# Patient Record
Sex: Male | Born: 1960 | Race: White | Hispanic: No | Marital: Married | Smoking: Never smoker
Health system: Southern US, Community
[De-identification: ages and names within clinical notes are randomized; demographics above are authoritative.]

## PROBLEM LIST (undated history)

## (undated) ENCOUNTER — Emergency Department (HOSPITAL_COMMUNITY): Discharge status: Absent without leave | Payer: 59

## (undated) DIAGNOSIS — M109 Gout, unspecified: Secondary | ICD-10-CM

## (undated) DIAGNOSIS — K429 Umbilical hernia without obstruction or gangrene: Secondary | ICD-10-CM

## (undated) DIAGNOSIS — K602 Anal fissure, unspecified: Secondary | ICD-10-CM

## (undated) HISTORY — DX: Umbilical hernia without obstruction or gangrene: K42.9

## (undated) HISTORY — PX: OTHER SURGICAL HISTORY: SHX169

---

## 2000-07-26 ENCOUNTER — Ambulatory Visit (HOSPITAL_COMMUNITY): Admission: RE | Admit: 2000-07-26 | Discharge: 2000-07-26 | Payer: Self-pay | Admitting: Internal Medicine

## 2000-07-26 ENCOUNTER — Encounter: Payer: Self-pay | Admitting: Internal Medicine

## 2001-12-27 ENCOUNTER — Emergency Department (HOSPITAL_COMMUNITY): Admission: EM | Admit: 2001-12-27 | Discharge: 2001-12-27 | Payer: Self-pay | Admitting: Emergency Medicine

## 2002-06-24 ENCOUNTER — Ambulatory Visit (HOSPITAL_COMMUNITY): Admission: RE | Admit: 2002-06-24 | Discharge: 2002-06-24 | Payer: Self-pay | Admitting: General Surgery

## 2003-10-22 ENCOUNTER — Ambulatory Visit (HOSPITAL_COMMUNITY): Admission: RE | Admit: 2003-10-22 | Discharge: 2003-10-22 | Payer: Self-pay | Admitting: Family Medicine

## 2010-04-11 ENCOUNTER — Encounter: Payer: Self-pay | Admitting: Internal Medicine

## 2010-07-15 ENCOUNTER — Other Ambulatory Visit (HOSPITAL_COMMUNITY): Payer: Self-pay | Admitting: Family Medicine

## 2010-07-15 DIAGNOSIS — M109 Gout, unspecified: Secondary | ICD-10-CM

## 2010-07-15 DIAGNOSIS — N509 Disorder of male genital organs, unspecified: Secondary | ICD-10-CM

## 2010-07-16 ENCOUNTER — Ambulatory Visit (HOSPITAL_COMMUNITY)
Admission: RE | Admit: 2010-07-16 | Discharge: 2010-07-16 | Disposition: A | Discharge status: Home / Self Care | Payer: BC Managed Care – PPO | Source: Ambulatory Visit | Attending: Family Medicine | Admitting: Family Medicine

## 2010-07-16 ENCOUNTER — Other Ambulatory Visit (HOSPITAL_COMMUNITY): Payer: Self-pay | Admitting: Family Medicine

## 2010-07-16 DIAGNOSIS — M109 Gout, unspecified: Secondary | ICD-10-CM

## 2010-07-16 DIAGNOSIS — N509 Disorder of male genital organs, unspecified: Secondary | ICD-10-CM

## 2010-07-16 DIAGNOSIS — X58XXXA Exposure to other specified factors, initial encounter: Secondary | ICD-10-CM | POA: Insufficient documentation

## 2010-07-16 DIAGNOSIS — S39848A Other specified injuries of external genitals, initial encounter: Secondary | ICD-10-CM | POA: Insufficient documentation

## 2010-07-16 DIAGNOSIS — S3994XA Unspecified injury of external genitals, initial encounter: Secondary | ICD-10-CM | POA: Insufficient documentation

## 2010-07-16 DIAGNOSIS — R1031 Right lower quadrant pain: Secondary | ICD-10-CM | POA: Insufficient documentation

## 2010-08-06 NOTE — H&P (Signed)
   [name], [name]                            ACCOUNT NO.:  [id number]   MEDICAL RECORD NO.:  [id number]                   PATIENT TYPE:   LOCATION:                                       FACILITY:   PHYSICIAN:  Henry Ray, M.D.               DATE OF BIRTH:  1960/11/17   DATE OF ADMISSION:  06/24/2002  DATE OF DISCHARGE:                                HISTORY & PHYSICAL   CHIEF COMPLAINT:  Hematochezia.   HISTORY OF PRESENT ILLNESS:  The patient is a 50 year old white male who is  referred for colonoscopy.  He has been having intermittent hematochezia with  episodes of hematochezia over the past year.  He denies any lightheadedness,  weight loss, fever, abdominal pain, constipation, diarrhea or melena.  He  denies hemorrhoidal problems.  There is a question of a history of an anal  fissure.  He has never had a colonoscopy.  There is no immediate family  history of colon carcinoma.   PAST MEDICAL HISTORY:  Unremarkable.   PAST SURGICAL HISTORY:  EGD in 1997.   CURRENT MEDICATIONS:  None.   ALLERGIES:  No known drug allergies.   REVIEW OF SYMPTOMS:  Noncontributory.   PHYSICAL EXAMINATION:  GENERAL:  The patient is a well-developed, well-  nourished, white male in no acute distress.  VITAL SIGNS:  Afebrile with vital signs stable.  LUNGS:  Clear to auscultation with equal breath sounds bilaterally.  HEART:  Regular rate and rhythm without S3, S4 or murmurs.  ABDOMEN:  Benign.  RECTAL:  Deferred to the procedure.   IMPRESSION:  Hematochezia.    PLAN:  The patient is scheduled for colonoscopy on June 24, 2002.  The risks  and benefits of the procedure including bleeding and perforation were fully  explained to the patient gaining informed consent.                                               Henry Ray, M.D.    MAJ/MEDQ  D:  06/20/2002  T:  06/20/2002  Job:  [id number]   cc:   Lorin Picket A. Gerda Diss, M.D.  [address], [address]  [place]  [place] [address]  Fax:  [phone]

## 2010-08-13 ENCOUNTER — Other Ambulatory Visit (HOSPITAL_COMMUNITY): Payer: Self-pay | Admitting: Internal Medicine

## 2010-08-13 DIAGNOSIS — R109 Unspecified abdominal pain: Secondary | ICD-10-CM

## 2010-08-13 DIAGNOSIS — N509 Disorder of male genital organs, unspecified: Secondary | ICD-10-CM

## 2010-08-17 ENCOUNTER — Encounter (HOSPITAL_COMMUNITY): Payer: Self-pay

## 2010-08-17 ENCOUNTER — Ambulatory Visit (HOSPITAL_COMMUNITY)
Admission: RE | Admit: 2010-08-17 | Discharge: 2010-08-17 | Disposition: A | Discharge status: Home / Self Care | Payer: BC Managed Care – PPO | Source: Ambulatory Visit | Attending: Internal Medicine | Admitting: Internal Medicine

## 2010-08-17 DIAGNOSIS — R109 Unspecified abdominal pain: Secondary | ICD-10-CM

## 2010-08-17 DIAGNOSIS — N432 Other hydrocele: Secondary | ICD-10-CM | POA: Insufficient documentation

## 2010-08-17 DIAGNOSIS — N509 Disorder of male genital organs, unspecified: Secondary | ICD-10-CM

## 2010-08-17 DIAGNOSIS — K4090 Unilateral inguinal hernia, without obstruction or gangrene, not specified as recurrent: Secondary | ICD-10-CM | POA: Insufficient documentation

## 2010-08-17 MED ORDER — IOHEXOL 300 MG/ML  SOLN
100.0000 mL | Freq: Once | INTRAMUSCULAR | Status: AC | PRN
  Administered 2010-08-17: 100 mL via INTRAVENOUS

## 2010-12-01 ENCOUNTER — Other Ambulatory Visit (HOSPITAL_COMMUNITY): Payer: Self-pay | Admitting: Family Medicine

## 2010-12-01 DIAGNOSIS — R1084 Generalized abdominal pain: Secondary | ICD-10-CM

## 2010-12-03 ENCOUNTER — Inpatient Hospital Stay (HOSPITAL_COMMUNITY): Admission: RE | Admit: 2010-12-03 | Payer: BC Managed Care – PPO | Source: Ambulatory Visit

## 2010-12-03 ENCOUNTER — Ambulatory Visit (HOSPITAL_COMMUNITY)
Admission: RE | Admit: 2010-12-03 | Discharge: 2010-12-03 | Disposition: A | Discharge status: Home / Self Care | Payer: BC Managed Care – PPO | Source: Ambulatory Visit | Attending: Family Medicine | Admitting: Family Medicine

## 2010-12-03 ENCOUNTER — Other Ambulatory Visit (HOSPITAL_COMMUNITY): Payer: Self-pay | Admitting: Physician Assistant

## 2010-12-03 DIAGNOSIS — K5730 Diverticulosis of large intestine without perforation or abscess without bleeding: Secondary | ICD-10-CM | POA: Insufficient documentation

## 2010-12-03 DIAGNOSIS — R1084 Generalized abdominal pain: Secondary | ICD-10-CM

## 2010-12-03 DIAGNOSIS — R109 Unspecified abdominal pain: Secondary | ICD-10-CM | POA: Insufficient documentation

## 2010-12-10 ENCOUNTER — Encounter: Payer: Self-pay | Admitting: Gastroenterology

## 2010-12-10 ENCOUNTER — Ambulatory Visit (INDEPENDENT_AMBULATORY_CARE_PROVIDER_SITE_OTHER): Payer: BC Managed Care – PPO | Admitting: Gastroenterology

## 2010-12-10 VITALS — BP 124/80 | HR 55 | Temp 97.0°F | Ht 68.0 in | Wt 196.6 lb

## 2010-12-10 DIAGNOSIS — K7689 Other specified diseases of liver: Secondary | ICD-10-CM

## 2010-12-10 DIAGNOSIS — K760 Fatty (change of) liver, not elsewhere classified: Secondary | ICD-10-CM | POA: Insufficient documentation

## 2010-12-10 DIAGNOSIS — K625 Hemorrhage of anus and rectum: Secondary | ICD-10-CM

## 2010-12-10 DIAGNOSIS — R109 Unspecified abdominal pain: Secondary | ICD-10-CM | POA: Insufficient documentation

## 2010-12-10 NOTE — Assessment & Plan Note (Signed)
Fatty liver seen on ultrasound. Refer to patient instructions. Retrieve labs from PCP for review, if no LFTs have been done we will check those.

## 2010-12-10 NOTE — Assessment & Plan Note (Signed)
Intermittent rectal bleeding. Last colonoscopy 10 years ago. Colonoscopy at this time for further evaluation. I have discussed the risks, alternatives, benefits with regards to but not limited to the risk of reaction to medication, bleeding, infection, perforation and the patient is agreeable to proceed. Written consent to be obtained.  Patient has been instructed to keep his stools soft. May add MiraLax as needed.

## 2010-12-10 NOTE — Patient Instructions (Addendum)
Colonoscopy as scheduled. Very important for you to keep your stools soft. If needed, add Miralax 17g daily. You can buy this over-the-counter. I reviewed your records and you did have an ultrasound of your gallbladder which was normal. You also had fatty liver.  I will obtain any available blood work from you family doctor.  Instructions for fatty liver: Recommend 1-2# weight loss per week until ideal body weight through exercise & diet. Low fat/cholesterol diet. Gradually increase exercise from 15 min daily up to 1 hr per day 5 days/week. Limit alcohol use.    Fatty Liver Hepatosteatosis, Steatohepatitis Fatty liver is the accumulation of fat in liver cells. It is also called hepatosteatosis or steatohepatitis. It is normal for your liver to contain some fat. If fat is more than 5-10% of your liver's weight, you have fatty liver.  There are often no symptoms (problems) for years while damage is still occurring. People often learn about their fatty liver when they have medical tests for other reasons. Fat can damage your liver for years or even decades without causing problems. When it becomes severe, it can cause fatigue, weight loss, weakness, and confusion. This makes you more likely to develop more serious liver problems. The liver is the largest organ in the body. It does a lot of work and often gives no warning signs when it is sick until late in a disease. The liver has many important jobs including:  Breaking down foods.   Storing vitamins, iron, and other minerals.   Making proteins.   Making bile for food digestion.   Breaking down many products including medications, alcohol and some poisons.  CAUSES There are a number of different conditions, medications, and poisons that can cause a fatty liver. Eating too many calories causes fat to build up in the liver. Not processing and breaking fats down normally may also cause this. Certain conditions, such as obesity, diabetes, and  high triglycerides also cause this. Most fatty liver patients tend to be middle-aged and over weight.  Some causes of fatty liver are:  Alcohol over consumption.  Malnutrition.   Steroid use.   Valproic acid toxicity.   Obesity.  Cushing's syndrome.   Poisons.   Tetracycline in high dosages.   Pregnancy.  Diabetes.   Hyperlipidemia.   Rapid weight loss.   Some people develop fatty liver even having none of these conditions. SYMPTOMS Fatty liver most often causes no problems. This is called asymptomatic.  It can be diagnosed with blood tests and also by a liver biopsy.   It is one of the most common causes of minor elevations of liver enzymes on routine blood tests.   Specialized Imaging of the liver using ultrasound, CT (computed tomography) scan, or MRI (magnetic resonance imaging) can suggest a fatty liver but a biopsy is needed to confirm it.   A biopsy involves taking a small sample of liver tissue. This is done by using a needle. It is then looked at under a microscope by a specialist.  TREATMENT  It is important to treat the cause. Simple fatty liver without a medical reason may not need treatment.  Weight loss, fat restriction, and exercise in overweight patients produces inconsistent results but is worth trying.   Fatty liver due to alcohol toxicity may not improve even with stopping drinking.   Good control of diabetes may reduce fatty liver.   Lower your triglycerides through diet, medication or both.   Eat a balanced, healthy diet.   Increase  your physical activity.   Get regular checkups from a liver specialist.   There are no medical or surgical treatments for a fatty liver or NASH, but improving your diet and increasing your exercise may help prevent or reverse some of the damage.  PROGNOSIS Fatty liver may cause no damage or it can lead to an inflammation of the liver. This is, called steatohepatitis. When it is linked to alcohol abuse, it is  called alcoholic steatohepatitis. It often is not linked to alcohol. It is then called nonalcoholic steatohepatitis, or NASH. Over time the liver may become scarred and hardened. This condition is called cirrhosis. Cirrhosis is serious and may lead to liver failure or cancer. NASH is one of the leading causes of cirrhosis. About 10-20% of Americans have fatty liver and a smaller 2-5% has NASH. Much of this information is from the [employer]. Last reviewed by [facility] 04-21-05 Document Released: 04/22/2005 Document Re-Released: 06/03/2008 [facility] Patient Information 2011 [place], [place].

## 2010-12-10 NOTE — Progress Notes (Signed)
Primary Care Physician:  Cassell Smiles., MD  Primary Gastroenterologist:  Roetta Sessions, MD   Chief Complaint  Patient presents with  . Abdominal Pain    right side and goes down    HPI:  Henry Ray is a 50 y.o. male here for further evaluation of intermittent rectal bleeding. H/O anal fissure years ago. Colonoscopy around 10 years ago with Dr. Lovell Sheehan. Patient states he's had intermittent rectal bleeding. Denies pain with bowel movement. Stools are fairly regular. Back in April of this year he had acute onset right-sided abdominal pain and at that time he also rectal bleeding. Workup included an abdominal ultrasound that showed diffuse fatty liver. He also had scrotal ultrasound due to testicular pain. He had bilateral hydroceles. Scrotal Doppler study showed no evidence of torsion or testicular mass. CT of the pelvis showed umbilical hernia and questionable left inguinal hernia. Patient states he was given antibiotic eventually the pain went away.  He had recurrent right-sided abdominal pain which began about 2 weeks ago.  Pain is in the right-mid abdomen but radiates into the upper and lower quadrants and down the right leg and into the right testicle. Since this episode of abdominal pain began he cut back on spicey food and alcohol. Last episode began after drinking four to five beers and eating spicy food. Pain always there with some intermittent severity but now almost gone away. Denies increased pain with lifting. Some moderate lifting, 45 lbs at a time, repetitive with work. Some heartburn occasionally, certain foods. Recently he had some mild constipation associated with this abdominal pain followed by diarrhea. Denies dysuria. States he had microscopic hematuria recently and PCP him on Cipro for this.    2-3 BC poweders in the last few months, prior to that some intermittent advil. Denies significant NSAID or aspirin use however.  Current Outpatient Prescriptions  Medication Sig  Dispense Refill  . allopurinol (ZYLOPRIM) 100 MG tablet Take 100 mg by mouth daily.       . ciprofloxacin (CIPRO) 500 MG tablet Take 500 mg by mouth 2 (two) times daily. 28 pills in bottle         Allergies as of 12/10/2010  . (No Known Allergies)    Past Medical History  Diagnosis Date  . Umbilical hernia   . Gout     Past Surgical History  Procedure Date  . None     Family History  Problem Relation Age of Onset  . Colon cancer Neg Hx     History   Social History  . Marital Status: Divorced    Spouse Name: N/A    Number of Children: 2  . Years of Education: N/A   Occupational History  .  [employer]   Social History Main Topics  . Smoking status: Never Smoker   . Smokeless tobacco: Not on file  . Alcohol Use: Yes     3 beers  . Drug Use: No  . Sexually Active: Not on file   Other Topics Concern  . Not on file   Social History Narrative  . No narrative on file      ROS:  General: Negative for anorexia, weight loss, fever, chills, fatigue, weakness. Eyes: Negative for vision changes.  ENT: Negative for hoarseness, difficulty swallowing , nasal congestion. CV: Negative for chest pain, angina, palpitations, dyspnea on exertion, peripheral edema.  Respiratory: Negative for dyspnea at rest, dyspnea on exertion, cough, sputum, wheezing.  GI: See history of present illness. GU:  Negative  for dysuria, hematuria, urinary incontinence, urinary frequency, nocturnal urination.  MS: Negative for joint pain, low back pain.  Derm: Negative for rash or itching.  Neuro: Negative for weakness, abnormal sensation, seizure, frequent headaches, memory loss, confusion.  Psych: Negative for anxiety, depression, suicidal ideation, hallucinations.  Endo: Negative for unusual weight change.  Heme: Negative for bruising or bleeding. Allergy: Negative for rash or hives.    Physical Examination:  BP 124/80  Pulse 55  Temp(Src) 97 F (36.1 C) (Temporal)  Ht 5'  8" (1.727 m)  Wt 196 lb 9.6 oz (89.177 kg)  BMI 29.89 kg/m2   General: Well-nourished, well-developed in no acute distress.  Head: Normocephalic, atraumatic.   Eyes: Conjunctiva pink, no icterus. Mouth: Oropharyngeal mucosa moist and pink , no lesions erythema or exudate. Neck: Supple without thyromegaly, masses, or lymphadenopathy.  Lungs: Clear to auscultation bilaterally.  Heart: Regular rate and rhythm, no murmurs rubs or gallops.  Abdomen: Bowel sounds are normal, mild right sided abd pain with palpation. No tenderness with rib margin. Nondistended, no hepatosplenomegaly or masses, no abdominal bruits or    hernia , no rebound or guarding.   Rectal: Defer to time of colonoscopy. Extremities: No lower extremity edema. No clubbing or deformities.  Neuro: Alert and oriented x 4 , grossly normal neurologically.  Skin: Warm and dry, no rash or jaundice.   Psych: Alert and cooperative, normal mood and affect.  Imaging Studies: Ct Abdomen Pelvis Wo Contrast  12/03/2010  *RADIOLOGY REPORT*  Clinical Data: 787.07, right side abdominal pain since May 2012  CT ABDOMEN AND PELVIS WITHOUT CONTRAST  Technique:  Multidetector CT imaging of the abdomen and pelvis was performed following the standard protocol without intravenous contrast. Sagittal and coronal MPR images reconstructed from axial data set. The patient drank dilute oral contrast for exam.  Comparison: CT pelvis 08/17/2010  Findings: Lung bases clear. Questionable tiny low attenuation focus at inferior pole of the left kidney 10 mm diameter, potentially small cyst. Within limits of a nonenhanced exam, remainder of liver, spleen, pancreas, kidneys, and adrenal glands normal appearance. Probable small splenules near spleen, less likely normal-sized lymph nodes. Single diverticulum at sigmoid colon. Stomach and bowel loops otherwise unremarkable. Normal-appearing ureters and bladder. Small umbilical and question left inguinal hernias. No mass,  adenopathy, free fluid or inflammatory process. No acute osseous findings.  IMPRESSION: Umbilical and question left inguinal hernias. Potentially tiny cyst inferior pole left kidney. Minimal sigmoid diverticulosis. No definite acute intra abdominal or intrapelvic process identified.  Original Report Authenticated By: Lollie Marrow, M.D.

## 2010-12-10 NOTE — Assessment & Plan Note (Signed)
Etiology not well defined at this time. CT of the pelvis and abdominal ultrasound several months ago with initial episode unrevealing. Patient has microscopic hematuria which may or may not be associated with this pain. Minimal change in bowels. Pain is resolving at this point. Possible musculoskeletal component. Colonoscopy is planned for further evaluation of rectal bleeding. Consider biliary dyskinesia as there is some postprandial component. Await colonoscopy findings.

## 2010-12-13 NOTE — Progress Notes (Signed)
Cc to PCP 

## 2010-12-16 MED ORDER — SODIUM CHLORIDE 0.45 % IV SOLN
Freq: Once | INTRAVENOUS | Status: AC
  Administered 2010-12-17: 12:00:00 via INTRAVENOUS

## 2010-12-17 ENCOUNTER — Encounter (HOSPITAL_COMMUNITY)
Admission: RE | Disposition: A | Discharge status: Home / Self Care | Payer: Self-pay | Source: Ambulatory Visit | Attending: Gastroenterology

## 2010-12-17 ENCOUNTER — Other Ambulatory Visit: Payer: Self-pay | Admitting: Gastroenterology

## 2010-12-17 ENCOUNTER — Encounter (HOSPITAL_COMMUNITY): Payer: Self-pay | Admitting: *Deleted

## 2010-12-17 ENCOUNTER — Ambulatory Visit (HOSPITAL_COMMUNITY)
Admission: RE | Admit: 2010-12-17 | Discharge: 2010-12-17 | Disposition: A | Discharge status: Home / Self Care | Payer: BC Managed Care – PPO | Source: Ambulatory Visit | Attending: Gastroenterology | Admitting: Gastroenterology

## 2010-12-17 DIAGNOSIS — K625 Hemorrhage of anus and rectum: Secondary | ICD-10-CM

## 2010-12-17 DIAGNOSIS — K5730 Diverticulosis of large intestine without perforation or abscess without bleeding: Secondary | ICD-10-CM | POA: Insufficient documentation

## 2010-12-17 DIAGNOSIS — R109 Unspecified abdominal pain: Secondary | ICD-10-CM

## 2010-12-17 DIAGNOSIS — K648 Other hemorrhoids: Secondary | ICD-10-CM | POA: Insufficient documentation

## 2010-12-17 DIAGNOSIS — K5289 Other specified noninfective gastroenteritis and colitis: Secondary | ICD-10-CM | POA: Insufficient documentation

## 2010-12-17 HISTORY — DX: Anal fissure, unspecified: K60.2

## 2010-12-17 HISTORY — PX: COLONOSCOPY: SHX5424

## 2010-12-17 SURGERY — COLONOSCOPY
Anesthesia: Moderate Sedation

## 2010-12-17 MED ORDER — STERILE WATER FOR IRRIGATION IR SOLN
Status: DC | PRN
  Administered 2010-12-17: 13:00:00

## 2010-12-17 MED ORDER — MIDAZOLAM HCL 5 MG/5ML IJ SOLN
INTRAMUSCULAR | Status: AC
  Filled 2010-12-17: qty 10

## 2010-12-17 MED ORDER — MEPERIDINE HCL 100 MG/ML IJ SOLN
INTRAMUSCULAR | Status: DC | PRN
  Administered 2010-12-17: 25 mg
  Administered 2010-12-17: 50 mg

## 2010-12-17 MED ORDER — MEPERIDINE HCL 100 MG/ML IJ SOLN
INTRAMUSCULAR | Status: AC
  Filled 2010-12-17: qty 2

## 2010-12-17 MED ORDER — MIDAZOLAM HCL 5 MG/5ML IJ SOLN
INTRAMUSCULAR | Status: DC | PRN
  Administered 2010-12-17 (×2): 2 mg via INTRAVENOUS

## 2010-12-17 NOTE — Progress Notes (Signed)
Ambulated to br to expel flatus. Unable to do so. States cramping is high in abdomen. Given 5 ml mylicon to drink for gas pain relief. Refuses to stay longer. States he will go home and walk in house to move gas lower so he can expel it.

## 2010-12-17 NOTE — Interval H&P Note (Signed)
History and Physical Interval Note:   12/17/2010   1:05 PM   Henry Ray  has presented today for surgery, with the diagnosis of rectal bleeding  The various methods of treatment have been discussed with the patient and family. After consideration of risks, benefits and other options for treatment, the patient has consented to  Procedure(s): COLONOSCOPY as a surgical intervention .  I have reviewed the patients' chart and labs.  Questions were answered to the patient's satisfaction.     Jonette Eva  MD

## 2010-12-17 NOTE — H&P (Signed)
Reason for Visit     Abdominal Pain    right side and goes down        Vitals - Last Recorded       BP Pulse Temp(Src) Ht Wt BMI    124/80  55  97 F (36.1 C) (Temporal)  5\' 8"  (1.727 m)  196 lb 9.6 oz (89.177 kg)  29.89 kg/m2          Progress Notes     Tana Coast, [place]  12/10/2010 12:23 PM  Signed Primary Care Physician:  Cassell Smiles., MD   Primary Gastroenterologist:  Roetta Sessions, MD      Chief Complaint   Patient presents with   .  Abdominal Pain       right side and goes down      HPI:  Henry Ray is a 50 y.o. male here for further evaluation of intermittent rectal bleeding. H/O anal fissure years ago. Colonoscopy around 10 years ago with Dr. Lovell Sheehan. Patient states he's had intermittent rectal bleeding. Denies pain with bowel movement. Stools are fairly regular. Back in April of this year he had acute onset right-sided abdominal pain and at that time he also rectal bleeding. Workup included an abdominal ultrasound that showed diffuse fatty liver. He also had scrotal ultrasound due to testicular pain. He had bilateral hydroceles. Scrotal Doppler study showed no evidence of torsion or testicular mass. CT of the pelvis showed umbilical hernia and questionable left inguinal hernia. Patient states he was given antibiotic eventually the pain went away.   He had recurrent right-sided abdominal pain which began about 2 weeks ago.  Pain is in the right-mid abdomen but radiates into the upper and lower quadrants and down the right leg and into the right testicle. Since this episode of abdominal pain began he cut back on spicey food and alcohol. Last episode began after drinking four to five beers and eating spicy food. Pain always there with some intermittent severity but now almost gone away. Denies increased pain with lifting. Some moderate lifting, 45 lbs at a time, repetitive with work. Some heartburn occasionally, certain foods. Recently he had some mild  constipation associated with this abdominal pain followed by diarrhea. Denies dysuria. States he had microscopic hematuria recently and PCP him on Cipro for this.     2-3 BC poweders in the last few months, prior to that some intermittent advil. Denies significant NSAID or aspirin use however.    Current Outpatient Prescriptions   Medication  Sig  Dispense  Refill   .  allopurinol (ZYLOPRIM) 100 MG tablet  Take 100 mg by mouth daily.          .  ciprofloxacin (CIPRO) 500 MG tablet  Take 500 mg by mouth 2 (two) times daily. 28 pills in bottle               Allergies as of 12/10/2010   .  (No Known Allergies)       Past Medical History   Diagnosis  Date   .  Umbilical hernia     .  Gout         Past Surgical History   Procedure  Date   .  None         Family History   Problem  Relation  Age of Onset   .  Colon cancer  Neg Hx         History  Social History   .  Marital Status:  Divorced       Spouse Name:  N/A       Number of Children:  2   .  Years of Education:  N/A       Occupational History   .    [employer]       Social History Main Topics   .  Smoking status:  Never Smoker    .  Smokeless tobacco:  Not on file   .  Alcohol Use:  Yes         3 beers   .  Drug Use:  No   .  Sexually Active:  Not on file       Other Topics  Concern   .  Not on file       Social History Narrative   .  No narrative on file        ROS:   General: Negative for anorexia, weight loss, fever, chills, fatigue, weakness. Eyes: Negative for vision changes.   ENT: Negative for hoarseness, difficulty swallowing , nasal congestion. CV: Negative for chest pain, angina, palpitations, dyspnea on exertion, peripheral edema.   Respiratory: Negative for dyspnea at rest, dyspnea on exertion, cough, sputum, wheezing.   GI: See history of present illness. GU:  Negative for dysuria, hematuria, urinary incontinence, urinary frequency, nocturnal urination.   MS:  Negative for joint pain, low back pain.   Derm: Negative for rash or itching.   Neuro: Negative for weakness, abnormal sensation, seizure, frequent headaches, memory loss, confusion.   Psych: Negative for anxiety, depression, suicidal ideation, hallucinations.   Endo: Negative for unusual weight change.   Heme: Negative for bruising or bleeding. Allergy: Negative for rash or hives.     Physical Examination:   BP 124/80  Pulse 55  Temp(Src) 97 F (36.1 C) (Temporal)  Ht 5\' 8"  (1.727 m)  Wt 196 lb 9.6 oz (89.177 kg)  BMI 29.89 kg/m2    General: Well-nourished, well-developed in no acute distress.   Head: Normocephalic, atraumatic.    Eyes: Conjunctiva pink, no icterus. Mouth: Oropharyngeal mucosa moist and pink , no lesions erythema or exudate. Neck: Supple without thyromegaly, masses, or lymphadenopathy.   Lungs: Clear to auscultation bilaterally.   Heart: Regular rate and rhythm, no murmurs rubs or gallops.   Abdomen: Bowel sounds are normal, mild right sided abd pain with palpation. No tenderness with rib margin. Nondistended, no hepatosplenomegaly or masses, no abdominal bruits or    hernia , no rebound or guarding.    Rectal: Defer to time of colonoscopy. Extremities: No lower extremity edema. No clubbing or deformities.   Neuro: Alert and oriented x 4 , grossly normal neurologically.   Skin: Warm and dry, no rash or jaundice.    Psych: Alert and cooperative, normal mood and affect.   Imaging Studies: Ct Abdomen Pelvis Wo Contrast   12/03/2010  *RADIOLOGY REPORT*  Clinical Data: 787.07, right side abdominal pain since May 2012  CT ABDOMEN AND PELVIS WITHOUT CONTRAST  Technique:  Multidetector CT imaging of the abdomen and pelvis was performed following the standard protocol without intravenous contrast. Sagittal and coronal MPR images reconstructed from axial data set. The patient drank dilute oral contrast for exam.  Comparison: CT pelvis 08/17/2010  Findings: Lung bases  clear. Questionable tiny low attenuation focus at inferior pole of the left kidney 10 mm diameter, potentially small cyst. Within limits of a nonenhanced exam, remainder of liver, spleen,  pancreas, kidneys, and adrenal glands normal appearance. Probable small splenules near spleen, less likely normal-sized lymph nodes. Single diverticulum at sigmoid colon. Stomach and bowel loops otherwise unremarkable. Normal-appearing ureters and bladder. Small umbilical and question left inguinal hernias. No mass, adenopathy, free fluid or inflammatory process. No acute osseous findings.  IMPRESSION: Umbilical and question left inguinal hernias. Potentially tiny cyst inferior pole left kidney. Minimal sigmoid diverticulosis. No definite acute intra abdominal or intrapelvic process identified.  Original Report Authenticated By: Lollie Marrow, M.D.            Glendora Score  12/13/2010  3:59 PM  Signed Cc to PCP        Right sided abdominal pain - Tana Coast, PA  12/10/2010 12:19 PM  Signed Etiology not well defined at this time. CT of the pelvis and abdominal ultrasound several months ago with initial episode unrevealing. Patient has microscopic hematuria which may or may not be associated with this pain. Minimal change in bowels. Pain is resolving at this point. Possible musculoskeletal component. Colonoscopy is planned for further evaluation of rectal bleeding. Consider biliary dyskinesia as there is some postprandial component. Await colonoscopy findings.  Rectal bleeding - Tana Coast, PA  12/10/2010 12:20 PM  Signed Intermittent rectal bleeding. Last colonoscopy 10 years ago. Colonoscopy at this time for further evaluation. I have discussed the risks, alternatives, benefits with regards to but not limited to the risk of reaction to medication, bleeding, infection, perforation and the patient is agreeable to proceed. Written consent to be obtained.   Patient has been instructed to keep his stools soft. May  add MiraLax as needed.  Fatty liver - Tana Coast, PA  12/10/2010 12:22 PM  Signed Fatty liver seen on ultrasound. Refer to patient instructions. Retrieve labs from PCP for review, if no LFTs have been done we will check those.

## 2010-12-23 ENCOUNTER — Telehealth: Payer: Self-pay | Admitting: Gastroenterology

## 2010-12-23 NOTE — Telephone Encounter (Signed)
Please call pt. HER COLON BIOPSY WAS NORMAL. TCS in 10 years. High fiber diet.

## 2010-12-23 NOTE — Telephone Encounter (Signed)
L/M to call.

## 2010-12-23 NOTE — Telephone Encounter (Signed)
Results Cc to PCP an 10yr reminder is nicd in the computer 

## 2010-12-24 ENCOUNTER — Encounter: Payer: Self-pay | Admitting: Gastroenterology

## 2010-12-27 ENCOUNTER — Encounter (HOSPITAL_COMMUNITY): Payer: Self-pay | Admitting: Gastroenterology

## 2010-12-27 NOTE — Telephone Encounter (Signed)
LMOM to call.

## 2010-12-27 NOTE — Telephone Encounter (Signed)
Pt informed

## 2012-11-27 IMAGING — CT CT ABD-PELV W/O CM
2 of 4 series · 16 of 46 positions shown, 18 images · non-contrast
Comparison: CT pelvis 08/17/2010

CLINICAL DATA: [DATE], right side abdominal pain since July 2010

CT ABDOMEN AND PELVIS WITHOUT CONTRAST
TECHNIQUE: Multidetector CT imaging of the abdomen and pelvis was
performed following the standard protocol without intravenous
contrast. Sagittal and coronal MPR images reconstructed from axial
data set. The patient drank dilute oral contrast for exam.

[Series 2: standard/full over (age)lbs 5.0 · axial · 0.80mm/px · z∈[-482,-42]mm · 13 of 96 slices shown, 15 images]
[im 4/96  soft-tissue]
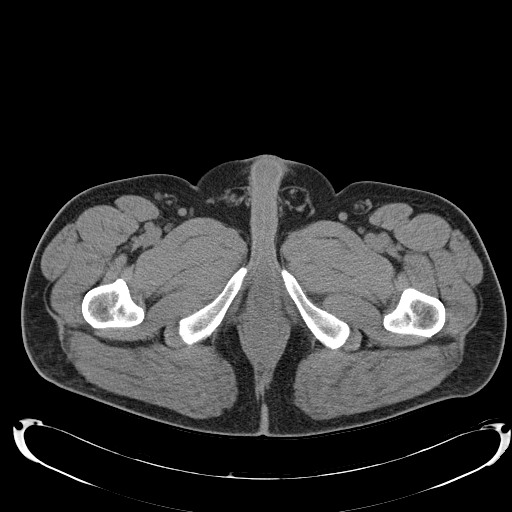
[im 4/96  bone]
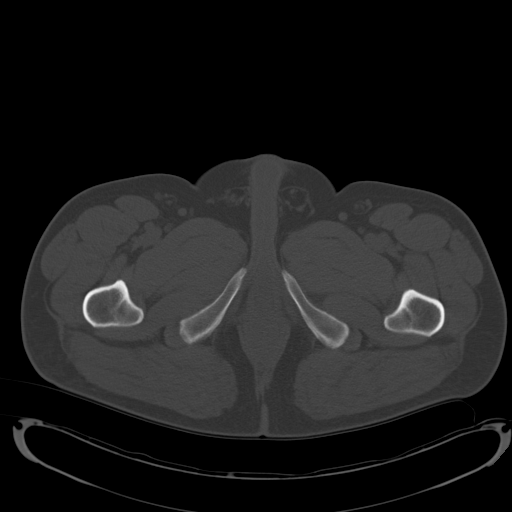
[im 12/96  soft-tissue]
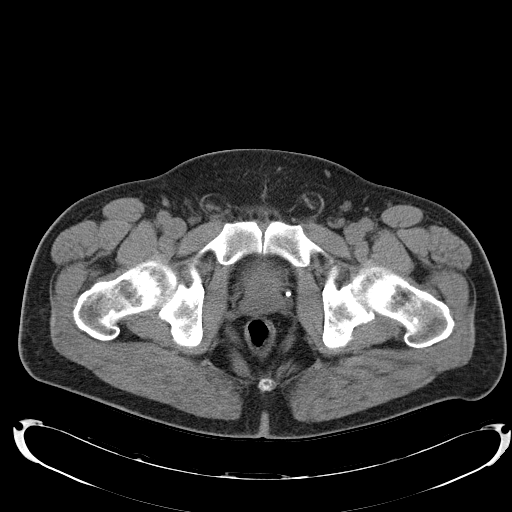
[im 20/96  soft-tissue]
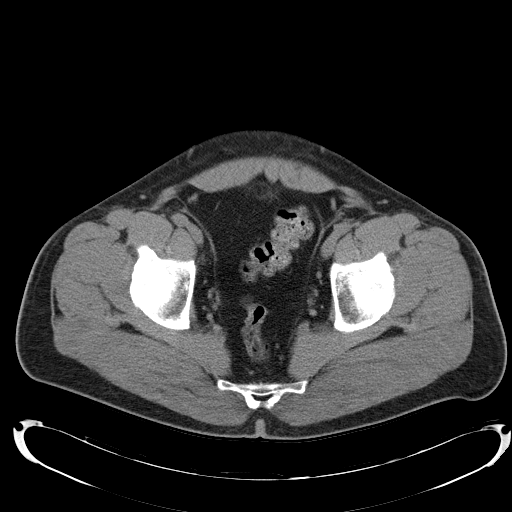
[im 28/96  soft-tissue]
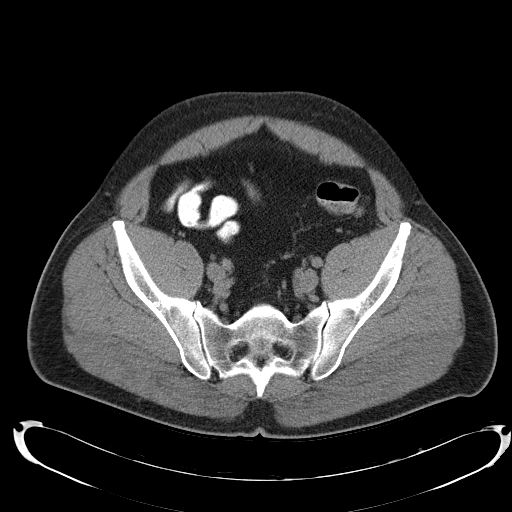
[im 32/96  soft-tissue]
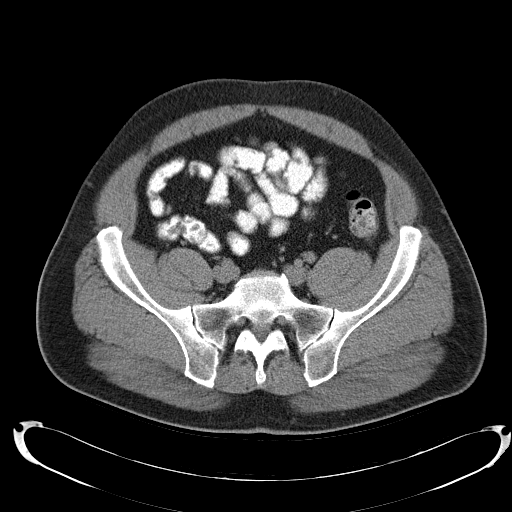
[im 40/96  soft-tissue]
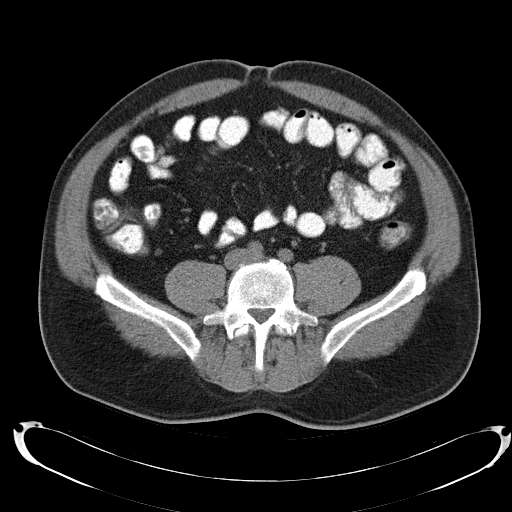
[im 48/96  soft-tissue]
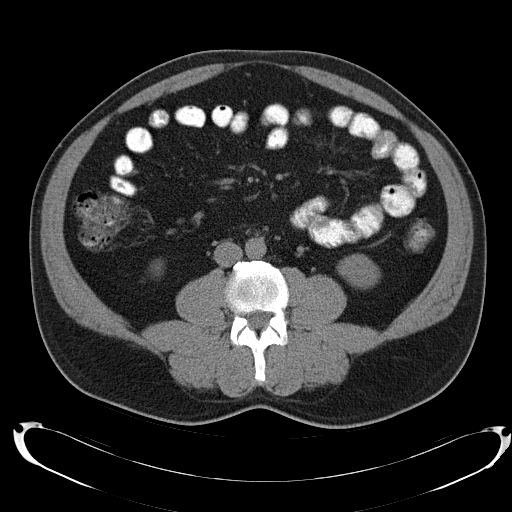
[im 56/96  soft-tissue]
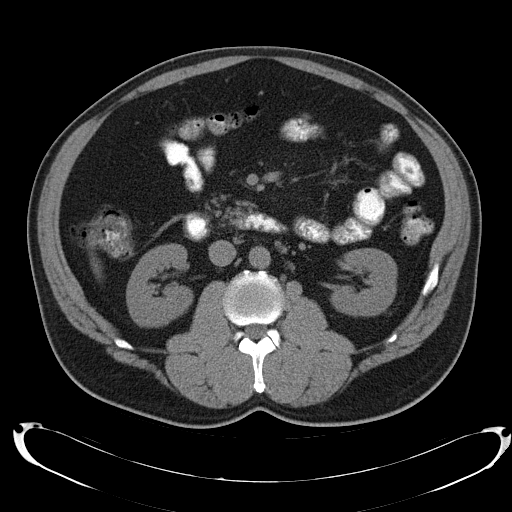
[im 64/96  soft-tissue]
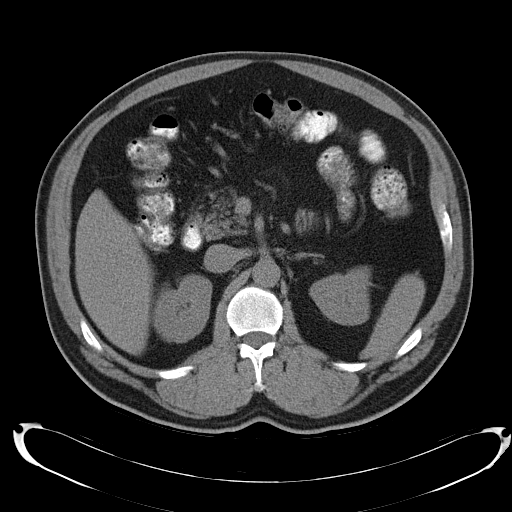
[im 64/96  bone]
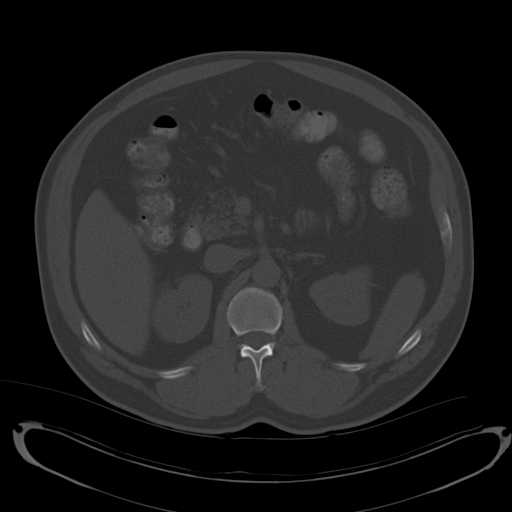
[im 68/96  soft-tissue]
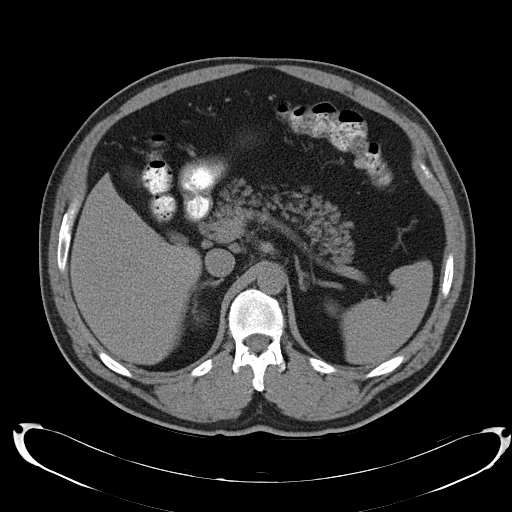
[im 76/96  soft-tissue]
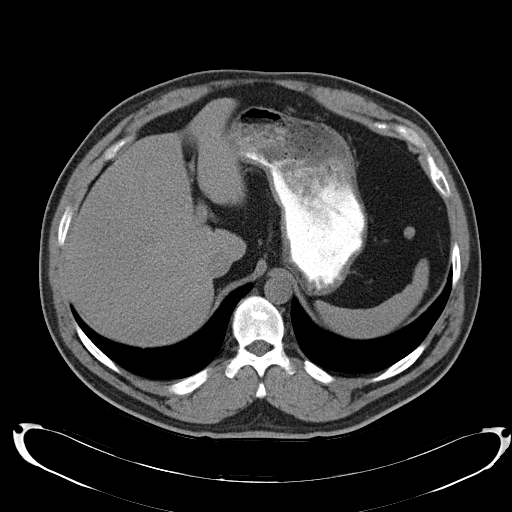
[im 84/96  soft-tissue]
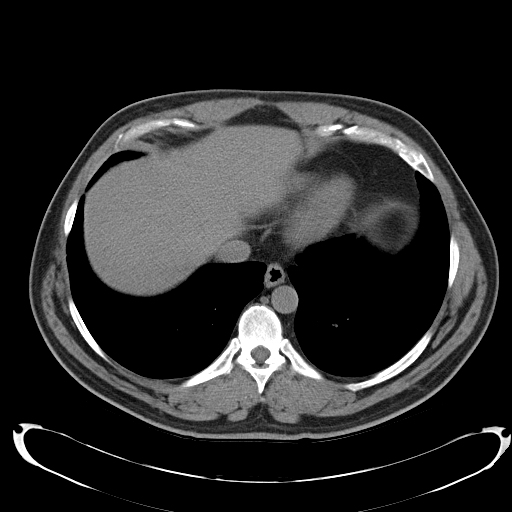
[im 92/96  soft-tissue]
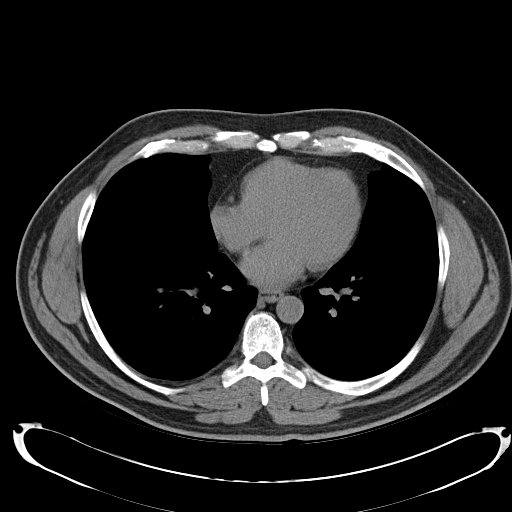

[Series 4: mpr coronal · coronal · 0.74mm/px · 3 of 98 slices shown]
[im 33/98  soft-tissue]
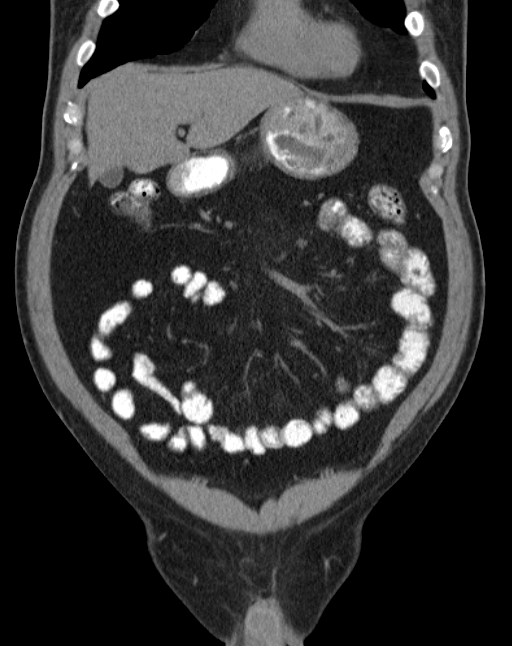
[im 44/98  soft-tissue]
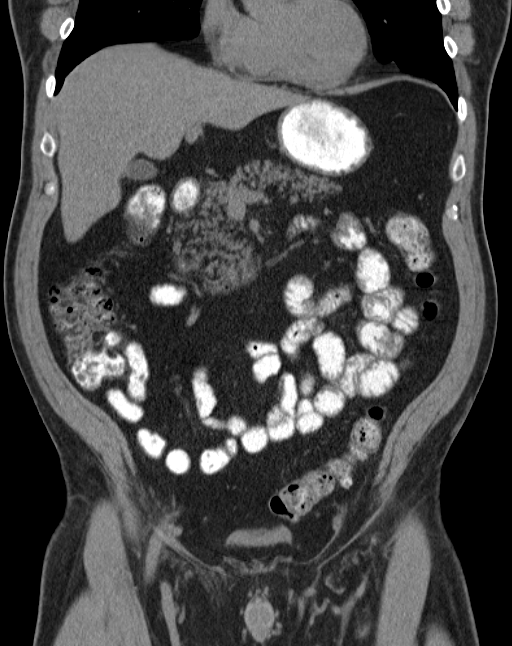
[im 54/98  soft-tissue]
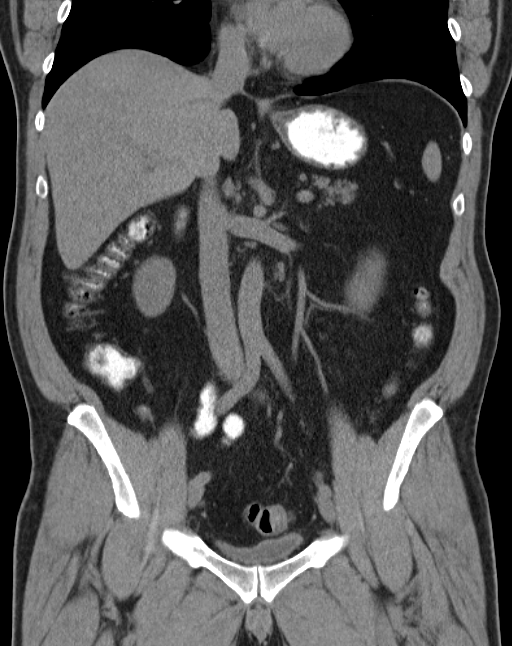

[16 of 46 positions shown; findings below may reference images not displayed]

FINDINGS: Lung bases clear.
Questionable tiny low attenuation focus at inferior pole of the
left kidney 10 mm diameter, potentially small cyst.
Within limits of a nonenhanced exam, remainder of liver, spleen,
pancreas, kidneys, and adrenal glands normal appearance.
Probable small splenules near spleen, less likely normal-sized
lymph nodes.
Single diverticulum at sigmoid colon.
Stomach and bowel loops otherwise unremarkable.
Normal-appearing ureters and bladder.
Small umbilical and question left inguinal hernias.
No mass, adenopathy, free fluid or inflammatory process.
No acute osseous findings.
IMPRESSION: Umbilical and question left inguinal hernias.
Potentially tiny cyst inferior pole left kidney.
Minimal sigmoid diverticulosis.
No definite acute intra abdominal or intrapelvic process
identified.

## 2013-03-29 ENCOUNTER — Emergency Department (HOSPITAL_COMMUNITY)
Admission: EM | Admit: 2013-03-29 | Discharge: 2013-03-29 | Disposition: A | Discharge status: Home / Self Care | Payer: Managed Care, Other (non HMO) | Attending: Emergency Medicine | Admitting: Emergency Medicine

## 2013-03-29 ENCOUNTER — Encounter (HOSPITAL_COMMUNITY): Payer: Self-pay | Admitting: Emergency Medicine

## 2013-03-29 DIAGNOSIS — M109 Gout, unspecified: Secondary | ICD-10-CM | POA: Insufficient documentation

## 2013-03-29 DIAGNOSIS — Z792 Long term (current) use of antibiotics: Secondary | ICD-10-CM | POA: Insufficient documentation

## 2013-03-29 DIAGNOSIS — Z79899 Other long term (current) drug therapy: Secondary | ICD-10-CM | POA: Insufficient documentation

## 2013-03-29 DIAGNOSIS — Z8719 Personal history of other diseases of the digestive system: Secondary | ICD-10-CM | POA: Insufficient documentation

## 2013-03-29 DIAGNOSIS — L03119 Cellulitis of unspecified part of limb: Principal | ICD-10-CM

## 2013-03-29 DIAGNOSIS — L02416 Cutaneous abscess of left lower limb: Secondary | ICD-10-CM

## 2013-03-29 DIAGNOSIS — L02419 Cutaneous abscess of limb, unspecified: Secondary | ICD-10-CM | POA: Insufficient documentation

## 2013-03-29 HISTORY — DX: Gout, unspecified: M10.9

## 2013-03-29 MED ORDER — LIDOCAINE HCL (PF) 2 % IJ SOLN
10.0000 mL | Freq: Once | INTRAMUSCULAR | Status: AC
  Administered 2013-03-29: 10 mL
  Filled 2013-03-29: qty 10

## 2013-03-29 MED ORDER — SODIUM CHLORIDE 0.9 % IV SOLN
Freq: Once | INTRAVENOUS | Status: AC
  Administered 2013-03-29: 10:00:00 via INTRAVENOUS

## 2013-03-29 MED ORDER — OXYCODONE-ACETAMINOPHEN 5-325 MG PO TABS: 1.0000 | ORAL_TABLET | Freq: Four times a day (QID) | ORAL | Status: AC | PRN

## 2013-03-29 MED ORDER — DEXTROSE 5 % IV SOLN
1.0000 g | Freq: Once | INTRAVENOUS | Status: AC
  Administered 2013-03-29: 1 g via INTRAVENOUS
  Filled 2013-03-29: qty 10

## 2013-03-29 MED ORDER — PROMETHAZINE HCL 25 MG/ML IJ SOLN
12.5000 mg | Freq: Once | INTRAMUSCULAR | Status: AC
  Administered 2013-03-29: 12.5 mg via INTRAVENOUS
  Filled 2013-03-29: qty 1

## 2013-03-29 MED ORDER — FENTANYL CITRATE 0.05 MG/ML IJ SOLN
50.0000 ug | Freq: Once | INTRAMUSCULAR | Status: AC
  Administered 2013-03-29: 50 ug via INTRAVENOUS
  Filled 2013-03-29: qty 2

## 2013-03-29 NOTE — ED Provider Notes (Signed)
CSN: [id number]     Arrival date & time 03/29/13  4098 History   First MD Initiated Contact with Patient 03/29/13 0914     Chief Complaint  Patient presents with  . Groin Pain   (Consider location/radiation/quality/duration/timing/severity/associated sxs/prior Treatment) HPI Comments: Patient presents to the emergency department with complaint of groin pain. The patient states that he has been dealing with this for some weeks, this got worse in the last 3 days. He was seen by his primary physician was told that he may have an infected cyst, and was sent to the emergency department for evaluation and/or surgical evaluation. The patient denies fever or chills. States that this area has become more and more painful. He has pain with walking, turning and twisting at the hip and thigh site area. She's not had any nausea or vomiting related to this. The patient does not have any immune system issues. It is of note that the patient has been on doxycycline for the past 3 days.  The history is provided by the patient.    Past Medical History  Diagnosis Date  . Umbilical hernia   . Gout   . Anal fissure   . Gout    Past Surgical History  Procedure Laterality Date  . None    . Colonoscopy  12/17/2010    Procedure: COLONOSCOPY;  Surgeon: Arlyce Harman, MD;  Location: [facility];  Service: Endoscopy;  Laterality: N/A;  1:00   Family History  Problem Relation Age of Onset  . Colon cancer Neg Hx    History  Substance Use Topics  . Smoking status: Never Smoker   . Smokeless tobacco: Not on file  . Alcohol Use: 0.0 oz/week     Comment: beer on weekends    Review of Systems  Constitutional: Negative for activity change.       All ROS Neg except as noted in HPI  HENT: Negative for nosebleeds.   Eyes: Negative for photophobia and discharge.  Respiratory: Negative for cough, shortness of breath and wheezing.   Cardiovascular: Negative for chest pain and palpitations.  Gastrointestinal:  Negative for abdominal pain and blood in stool.  Genitourinary: Negative for dysuria, frequency and hematuria.  Musculoskeletal: Positive for arthralgias. Negative for back pain and neck pain.  Skin: Positive for wound.  Neurological: Negative for dizziness, seizures and speech difficulty.  Psychiatric/Behavioral: Negative for hallucinations and confusion.    Allergies  Review of patient's allergies indicates no known allergies.  Home Medications   Current Outpatient Rx  Name  Route  Sig  Dispense  Refill  . allopurinol (ZYLOPRIM) 100 MG tablet   Oral   Take 100 mg by mouth daily.          . ciprofloxacin (CIPRO) 500 MG tablet   Oral   Take 500 mg by mouth 2 (two) times daily. 28 pills in bottle          . clotrimazole-betamethasone (LOTRISONE) cream   Topical   Apply 1 application topically daily as needed.         . doxycycline (VIBRA-TABS) 100 MG tablet   Oral   Take 1 tablet by mouth daily.         Marland Kitchen HYDROcodone-acetaminophen (NORCO/VICODIN) 5-325 MG per tablet   Oral   Take 1 tablet by mouth 4 (four) times daily.          BP 135/85  Pulse 75  Temp(Src) 97.8 F (36.6 C) (Oral)  Resp 18  SpO2 100%  Physical Exam  Musculoskeletal:       Legs: Femoral and DP pulse 2+. The abscess area of the left inner thigh is warm to touch and painful. No red streaks appreciated. There is mild drainage from the site.    ED Course  INCISION AND DRAINAGE Date/Time: 03/29/2013 11:00 AM Performed by: Kathie DikeBRYANT, Paytan Recine M Authorized by: Kathie DikeBRYANT, Berenice Oehlert M Consent: Verbal consent obtained. Risks and benefits: risks, benefits and alternatives were discussed Consent given by: patient Patient understanding: patient states understanding of the procedure being performed Patient identity confirmed: arm band Time out: Immediately prior to procedure a "time out" was called to verify the correct patient, procedure, equipment, support staff and site/side marked as required. Type:  abscess Body area: lower extremity Location details: left leg Anesthesia: local infiltration Local anesthetic: lidocaine 2% without epinephrine Patient sedated: no Scalpel size: 11 Incision type: single straight Complexity: simple Drainage: purulent Drainage amount: moderate Wound treatment: wound left open Patient tolerance: Patient tolerated the procedure well with no immediate complications.   (including critical care time) Labs Review Labs Reviewed  WOUND CULTURE   Imaging Review No results found.  EKG Interpretation   None       MDM  No diagnosis found. *I have reviewed nursing notes, vital signs, and all appropriate lab and imaging results for this patient.**  Vital signs are well within normal limits. Pulse oximetry is 100% on room air. Within normal limits by my interpretation. Patient rates his pain on admission at 7/10.  Patient underwent incision and drainage of abscess involving the left upper inner thigh. Patient tolerated the procedure without problem.  The plan at this time is for the patient to continue and finish his doxycycline. The patient was given 1 g of Rocephin while in the emergency department. Prescription for Percocet one every 6 hours given to the patient for his pain. On tomorrow January 10, the patient will begin warm tub soaks. I've instructed the patient to give Dr. Lovell SheehanJenkins (surgeon) call for followup next week. Patient is invited He is helping her: I and to light really ago and to return to the emergency department immediately if any high fevers, or deterioration in his condition.  Kathie DikeHobson M Darcia Lampi, PA-C 03/29/13 1104

## 2013-03-29 NOTE — ED Notes (Signed)
Pt alert & oriented x4, stable gait. Patient given discharge instructions, paperwork & prescription(s). Patient  instructed to stop at the registration desk to finish any additional paperwork. Patient verbalized understanding. Pt left department w/ no further questions. 

## 2013-03-29 NOTE — Discharge Instructions (Signed)
Starting tomorrow January 10, please soak in a tub of warm Epsom salt water for 15-20 minutes. Please do this daily until the wound heals from the inside out. Please call Dr. Lovell SheehanJenkins for office appointment next week. Please finish your doxycycline. Please see Dr. Sherwood GamblerFusco, or return to the emergency department if any high fevers, or deterioration of your condition. Abscess An abscess (boil or furuncle) is an infected area on or under the skin. This area is filled with yellowish-white fluid (pus) and other material (debris). HOME CARE   Only take medicines as told by your doctor.  If you were given antibiotic medicine, take it as directed. Finish the medicine even if you start to feel better.  If gauze is used, follow your doctor's directions for changing the gauze.  To avoid spreading the infection:  Keep your abscess covered with a bandage.  Wash your hands well.  Do not share personal care items, towels, or whirlpools with others.  Avoid skin contact with others.  Keep your skin and clothes clean around the abscess.  Keep all doctor visits as told. GET HELP RIGHT AWAY IF:   You have more pain, puffiness (swelling), or redness in the wound site.  You have more fluid or blood coming from the wound site.  You have muscle aches, chills, or you feel sick.  You have a fever. MAKE SURE YOU:   Understand these instructions.  Will watch your condition.  Will get help right away if you are not doing well or get worse. Document Released: 08/24/2007 Document Revised: 09/06/2011 Document Reviewed: 05/20/2011 [facility] Patient Information 2014 [employer] [place].

## 2013-03-29 NOTE — ED Provider Notes (Signed)
Medical screening examination/treatment/procedure(s) were performed by non-physician practitioner and as supervising physician I was immediately available for consultation/collaboration.  EKG Interpretation   None         Aamirah Salmi L Liliah Dorian, MD 03/29/13 1515 

## 2013-03-29 NOTE — ED Notes (Signed)
Pt has cyst to left groin. Has been to PCP several times but has not done anything. Sent here for dr Lovell Sheehanjenkins to see who pt states dr Sherwood Gamblerfusco called him. Has slight limp

## 2013-04-01 ENCOUNTER — Telehealth (HOSPITAL_COMMUNITY): Payer: Self-pay

## 2013-04-01 LAB — CULTURE, ROUTINE-ABSCESS

## 2013-04-01 NOTE — ED Notes (Signed)
Results called from [facility].  Rt Thigh -> (+) moderate MRSA.  Rx given by PCP for Doxycycline (3 days ago for 10 day) & I&D 03/29/13 -> sensitive to the same.  Call and notify pt.  General FYI set for MRSA Hx

## 2013-04-01 NOTE — ED Notes (Signed)
Patient informed of positive results and educated to MRSA precautions.

## 2016-04-22 DIAGNOSIS — Z1389 Encounter for screening for other disorder: Secondary | ICD-10-CM | POA: Diagnosis not present

## 2016-04-22 DIAGNOSIS — M1A00X0 Idiopathic chronic gout, unspecified site, without tophus (tophi): Secondary | ICD-10-CM | POA: Diagnosis not present

## 2016-04-27 DIAGNOSIS — M1000 Idiopathic gout, unspecified site: Secondary | ICD-10-CM | POA: Diagnosis not present

## 2016-11-25 ENCOUNTER — Emergency Department (HOSPITAL_COMMUNITY): Admission: EM | Admit: 2016-11-25 | Discharge: 2016-11-25 | Discharge status: Against Medical Advice | Payer: 59

## 2016-11-25 NOTE — ED Triage Notes (Signed)
Per registration-pt stated he had to leave.

## 2016-11-28 DIAGNOSIS — H11431 Conjunctival hyperemia, right eye: Secondary | ICD-10-CM | POA: Diagnosis not present

## 2016-12-02 DIAGNOSIS — S0501XD Injury of conjunctiva and corneal abrasion without foreign body, right eye, subsequent encounter: Secondary | ICD-10-CM | POA: Diagnosis not present

## 2016-12-05 DIAGNOSIS — S0501XS Injury of conjunctiva and corneal abrasion without foreign body, right eye, sequela: Secondary | ICD-10-CM | POA: Diagnosis not present

## 2017-05-08 DIAGNOSIS — E6609 Other obesity due to excess calories: Secondary | ICD-10-CM | POA: Diagnosis not present

## 2017-05-08 DIAGNOSIS — E782 Mixed hyperlipidemia: Secondary | ICD-10-CM | POA: Diagnosis not present

## 2017-05-08 DIAGNOSIS — M1A00X0 Idiopathic chronic gout, unspecified site, without tophus (tophi): Secondary | ICD-10-CM | POA: Diagnosis not present

## 2018-01-12 DIAGNOSIS — Z6830 Body mass index (BMI) 30.0-30.9, adult: Secondary | ICD-10-CM | POA: Diagnosis not present

## 2018-01-12 DIAGNOSIS — E6609 Other obesity due to excess calories: Secondary | ICD-10-CM | POA: Diagnosis not present

## 2018-01-12 DIAGNOSIS — J069 Acute upper respiratory infection, unspecified: Secondary | ICD-10-CM | POA: Diagnosis not present

## 2018-04-11 DIAGNOSIS — E6609 Other obesity due to excess calories: Secondary | ICD-10-CM | POA: Diagnosis not present

## 2018-04-11 DIAGNOSIS — J019 Acute sinusitis, unspecified: Secondary | ICD-10-CM | POA: Diagnosis not present

## 2018-04-11 DIAGNOSIS — M109 Gout, unspecified: Secondary | ICD-10-CM | POA: Diagnosis not present

## 2018-05-28 DIAGNOSIS — J329 Chronic sinusitis, unspecified: Secondary | ICD-10-CM | POA: Diagnosis not present

## 2018-05-28 DIAGNOSIS — R05 Cough: Secondary | ICD-10-CM | POA: Diagnosis not present

## 2018-05-28 DIAGNOSIS — J343 Hypertrophy of nasal turbinates: Secondary | ICD-10-CM | POA: Diagnosis not present

## 2020-05-04 ENCOUNTER — Other Ambulatory Visit: Payer: Self-pay

## 2020-05-04 ENCOUNTER — Other Ambulatory Visit (HOSPITAL_COMMUNITY): Payer: Self-pay | Admitting: Family Medicine

## 2020-05-04 ENCOUNTER — Ambulatory Visit (HOSPITAL_COMMUNITY)
Admission: RE | Admit: 2020-05-04 | Discharge: 2020-05-04 | Disposition: A | Discharge status: Home / Self Care | Payer: 59 | Source: Ambulatory Visit | Attending: Family Medicine | Admitting: Family Medicine

## 2020-05-04 DIAGNOSIS — S99922A Unspecified injury of left foot, initial encounter: Secondary | ICD-10-CM

## 2020-10-16 ENCOUNTER — Encounter: Payer: Self-pay | Admitting: *Deleted

## 2020-10-27 ENCOUNTER — Emergency Department (HOSPITAL_COMMUNITY)
Admission: EM | Admit: 2020-10-27 | Discharge: 2020-10-28 | Disposition: A | Discharge status: Home / Self Care | Payer: Worker's Compensation | Attending: Emergency Medicine | Admitting: Emergency Medicine

## 2020-10-27 DIAGNOSIS — X58XXXA Exposure to other specified factors, initial encounter: Secondary | ICD-10-CM | POA: Insufficient documentation

## 2020-10-27 DIAGNOSIS — Y990 Civilian activity done for income or pay: Secondary | ICD-10-CM | POA: Insufficient documentation

## 2020-10-27 DIAGNOSIS — T1592XA Foreign body on external eye, part unspecified, left eye, initial encounter: Secondary | ICD-10-CM

## 2020-10-27 DIAGNOSIS — T1512XA Foreign body in conjunctival sac, left eye, initial encounter: Secondary | ICD-10-CM | POA: Insufficient documentation

## 2020-10-27 NOTE — ED Provider Notes (Signed)
Emergency Medicine Provider Triage Evaluation Note  Henry Ray , a 60 y.o. male  was evaluated in triage.  Pt complains of left eye injury.  The patient reports that at approximately 8:30 PM that he reached in his toolbox and thought he grabbed a container of eyedrops, but inadvertently grabbed a tube of superglue.  He administered several drops of superglue to the left eye.  After realizing his mistake, he was evaluated by the nurse at his job who copiously irrigated the eye.  He continues to have left sharp left eye pain that feels like a cut on his eye.  He wears reading glasses, but does not wear contacts.  No diplopia, purulent discharge, periorbital redness or swelling.  Review of Systems  Positive: Blurred vision, left eye redness, left eye pain Negative: Periorbital redness, swelling, purulent discharge, diplopia, fever, or chills  Physical Exam  BP (!) 158/93 (BP Location: Left Arm)   Pulse 64   Temp 98.2 F (36.8 C) (Oral)   Resp 18   SpO2 97%  Gen:   Awake, no distress   Resp:  Normal effort  MSK:   Moves extremities without difficulty  Other:  Left eye is injected.  Dried glue noted to the inferior periorbital region on the left.  Pupils are equal round and reactive to light.  Extraocular movements are intact.  Medical Decision Making  Medically screening exam initiated at 9:59 PM.  Appropriate orders placed.  Zella Ball was informed that the remainder of the evaluation will be completed by another provider, this initial triage assessment does not replace that evaluation, and the importance of remaining in the ED until their evaluation is complete.  Patient presenting with superglue to the left eye.  He will need a complete eye exam and further work-up and evaluation in the ED.   Barkley Boards, PA-C 10/27/20 2225    Linwood Dibbles, MD 10/28/20 2020

## 2020-10-27 NOTE — ED Triage Notes (Signed)
Pt states he got superglue in the white of L eye approx 2030, states he thought it was eye drops. Has been flushing eye since, took advil for pain

## 2020-10-28 MED ORDER — FLUORESCEIN SODIUM 1 MG OP STRP
1.0000 | ORAL_STRIP | Freq: Once | OPHTHALMIC | Status: AC
  Administered 2020-10-28: 1 via OPHTHALMIC
  Filled 2020-10-28: qty 1

## 2020-10-28 MED ORDER — ERYTHROMYCIN 5 MG/GM OP OINT: TOPICAL_OINTMENT | OPHTHALMIC | 0 refills | Status: AC

## 2020-10-28 MED ORDER — TETRACAINE HCL 0.5 % OP SOLN
2.0000 [drp] | Freq: Once | OPHTHALMIC | Status: AC
  Administered 2020-10-28: 2 [drp] via OPHTHALMIC
  Filled 2020-10-28: qty 4

## 2020-10-28 NOTE — Discharge Instructions (Addendum)
At this time there does not appear to be the presence of an emergent medical condition, however there is always the potential for conditions to change. Please read and follow the below instructions.  Please return to the Emergency Department immediately for any new or worsening symptoms. Please be sure to follow up with your Primary Care Provider within one week regarding your visit today; please call their office to schedule an appointment even if you are feeling better for a follow-up visit. Please place the antibiotic ointment erythromycin into your lower eyelid 5 times a day to avoid development of an infection. Please call the ophthalmologist Dr. Eliane Decree office today as soon as you leave the emergency department to schedule a follow-up appointment. Your blood pressure was slightly elevated in the ER today.  Please have her rechecked by her primary care provider within the next week.  Please discuss medication management with him at that time.  Go to the nearest Emergency Department immediately if: You have fever or chills Your ability to see (vision) gets worse. You have more redness and swelling in or around your eye. You have any new/concerning or worsening of symptoms   Please read the additional information packets attached to your discharge summary.  Do not take your medicine if  develop an itchy rash, swelling in your mouth or lips, or difficulty breathing; call 911 and seek immediate emergency medical attention if this occurs.  You may review your lab tests and imaging results in their entirety on your MyChart account.  Please discuss all results of fully with your primary care provider and other specialist at your follow-up visit.  Note: Portions of this text may have been transcribed using voice recognition software. Every effort was made to ensure accuracy; however, inadvertent computerized transcription errors may still be present.

## 2020-10-28 NOTE — ED Notes (Signed)
Reviewed discharge instructions with patient. Follow-up care and medications reviewed. Patient  verbalized understanding. Patient A&Ox4, VSS, and ambulatory with steady gait upon discharge.  °

## 2020-10-28 NOTE — ED Provider Notes (Signed)
[facility] EMERGENCY DEPARTMENT Provider Note   CSN: [id number] Arrival date & time: 10/27/20  2119     History Chief Complaint  Patient presents with   Foreign Body in Eye    superglue   Eye Pain    Henry Ray is a 60 y.o. male reports a history of gout otherwise healthy presents today with left eye pain.  Patient reports last night he accidentally put superglue into his eye instead of eyedrops.  Patient irrigated his eye and had a nurse at work help pull out some of the glue prior to arrival.  Patient reports some mild burning eye pain that is constant no aggravating factors, no alleviating factors, pain does not radiate.  Patient reports some mildly blurred vision associated with his symptoms.  Denies headache, eye swelling, drainage, fever/chills or any additional concerns.  HPI     Past Medical History:  Diagnosis Date   Anal fissure    Gout    Gout    Umbilical hernia     Patient Active Problem List   Diagnosis Date Noted   Rectal bleeding 12/10/2010   Right sided abdominal pain 12/10/2010   Fatty liver 12/10/2010    Past Surgical History:  Procedure Laterality Date   COLONOSCOPY  12/17/2010   Procedure: COLONOSCOPY;  Surgeon: Arlyce Harman, MD;  Location: [facility];  Service: Endoscopy;  Laterality: N/A;  1:00   none         Family History  Problem Relation Age of Onset   Colon cancer Neg Hx     Social History   Tobacco Use   Smoking status: Never  Substance Use Topics   Alcohol use: Yes    Comment: beer on weekends   Drug use: No    Home Medications Prior to Admission medications   Medication Sig Start Date End Date Taking? Authorizing Provider  erythromycin ophthalmic ointment Place a 1/2 inch ribbon of ointment into the lower eyelid five times a day. 10/28/20  Yes Harlene Salts A, PA-C  allopurinol (ZYLOPRIM) 100 MG tablet Take 100 mg by mouth daily.  11/09/10   [provider]  ciprofloxacin (CIPRO) 500  MG tablet Take 500 mg by mouth 2 (two) times daily. 28 pills in bottle  11/29/10   [provider]  clotrimazole-betamethasone (LOTRISONE) cream Apply 1 application topically daily as needed. 03/19/13   [provider]  doxycycline (VIBRA-TABS) 100 MG tablet Take 1 tablet by mouth daily. 03/27/13   [provider]  HYDROcodone-acetaminophen (NORCO/VICODIN) 5-325 MG per tablet Take 1 tablet by mouth 4 (four) times daily. 03/27/13   [provider]  oxyCODONE-acetaminophen (PERCOCET/ROXICET) 5-325 MG per tablet Take 1 tablet by mouth every 6 (six) hours as needed for severe pain. 03/29/13   Ivery Quale, PA-C    Allergies    Patient has no known allergies.  Review of Systems   Review of Systems  Constitutional: Negative.  Negative for chills and fever.  Eyes:  Positive for pain and redness. Negative for discharge.  Neurological: Negative.  Negative for headaches.   Physical Exam Updated Vital Signs BP (!) 150/93 (BP Location: Right Arm)   Pulse (!) 57   Temp 98.2 F (36.8 C) (Oral)   Resp 16   SpO2 100%   Physical Exam Constitutional:      General: He is not in acute distress.    Appearance: Normal appearance. He is well-developed. He is not ill-appearing or diaphoretic.  HENT:  Head: Normocephalic and atraumatic.  Eyes:     General: Vision grossly intact. Gaze aligned appropriately.     Extraocular Movements: Extraocular movements intact.     Pupils: Pupils are equal, round, and reactive to light.     Comments: Left Eye: Appearance injected conjunctiva, small amount of glue present to the medial commissure which was removed gently with cotton swab. No erythema or scleral icterus. No discharge. Conjunctiva clear. PEERL intact. EOMI without nystagmus. No photophobia or consensual photophobia.   Corneal Abrasion Exam Verbal Consent Obtained. Risks, benefits and alternatives explained. 2 drops of tetracaine (PONTOCAINE) 0.5 % ophthalmic solution  were applied to the eye. Fluorescein 1 MG ophthalmic strip applied the the surface of the eye Wood's lamp used to screen for abrasion. No increased fluorescein uptake. No corneal ulcer. Negative Seidel sign. No foreign bodies noted. No visible hyphema.  Eye flushed with sterile saline. Patient tolerated the procedure well  TONOPEN: 16 LEFT, 17 RIGHT    Neck:     Trachea: Trachea and phonation normal.  Pulmonary:     Effort: Pulmonary effort is normal. No respiratory distress.  Abdominal:     General: There is no distension.     Palpations: Abdomen is soft.     Tenderness: There is no abdominal tenderness. There is no guarding or rebound.  Musculoskeletal:        General: Normal range of motion.     Cervical back: Normal range of motion.  Skin:    General: Skin is warm and dry.  Neurological:     Mental Status: He is alert.     GCS: GCS eye subscore is 4. GCS verbal subscore is 5. GCS motor subscore is 6.     Comments: Speech is clear and goal oriented, follows commands Major Cranial nerves without deficit, no facial droop Moves extremities without ataxia, coordination intact  Psychiatric:        Behavior: Behavior normal.    ED Results / Procedures / Treatments   Labs (all labs ordered are listed, but only abnormal results are displayed) Labs Reviewed - No data to display  EKG None  Radiology No results found.  Procedures Procedures   Medications Ordered in ED Medications  fluorescein ophthalmic strip 1 strip (1 strip Left Eye Given by Other 10/28/20 0858)  tetracaine (PONTOCAINE) 0.5 % ophthalmic solution 2 drop (2 drops Left Eye Given by Other 10/28/20 6433)    ED Course  I have reviewed the triage vital signs and the nursing notes.  Pertinent labs & imaging results that were available during my care of the patient were reviewed by me and considered in my medical decision making (see chart for details).    MDM Rules/Calculators/A&P                           Additional history obtained from: Nursing notes from this visit. ------------------- 60 year old male presented with superglue in the left eye, he mistook his eyedrops for glue last night.  Majority of glue was removed by nurse at patient's work.  Patient reports some mild burning at the eye he has injected conjunctive a, no evidence of foreign body in the eye on my exam.  Small amount of glue was removed from the medial commissure.  Fluorescein exam shows no evidence for corneal abrasion or ulcer at this time.  He has full EOMs.  Visual daily slightly decreased left eye compared to right eye.  I consulted ophthalmology and  spoke with Dr. Allena Katz who advised starting patient on erythromycin ointment and for the patient to call his office to schedule a follow-up appointment.  Patient was made aware of these recommendations and stated understanding.  Of note patient is not a contact lens user.  Of note patient cannot recall when his last tetanus shot was.  Offered patient a tetanus shot today and he declined.  I encouraged patient to speak with his primary care provider this week to see when his last tetanus shot was and to update it if indicated.   At this time there does not appear to be any evidence of an acute emergency medical condition and the patient appears stable for discharge with appropriate outpatient follow up. Diagnosis was discussed with patient who verbalizes understanding of care plan and is agreeable to discharge. I have discussed return precautions with patient  who verbalizes understanding. Patient encouraged to follow-up with their PCP and ophthalmology. All questions answered.    Note: Portions of this report may have been transcribed using voice recognition software. Every effort was made to ensure accuracy; however, inadvertent computerized transcription errors may still be present.  Final Clinical Impression(s) / ED Diagnoses Final diagnoses:  Foreign body of left eye,  initial encounter    Rx / DC Orders ED Discharge Orders          Ordered    erythromycin ophthalmic ointment        10/28/20 0927             Bill Salinas, PA-C 10/28/20 0936    Pollyann Savoy, MD 10/28/20 1108

## 2020-10-28 NOTE — ED Notes (Signed)
Eye irrigated w/ NS by Olevia Bowens PA

## 2023-09-24 ENCOUNTER — Encounter (HOSPITAL_COMMUNITY): Payer: Self-pay

## 2023-09-24 ENCOUNTER — Emergency Department (HOSPITAL_COMMUNITY): Payer: Worker's Compensation

## 2023-09-24 ENCOUNTER — Emergency Department (HOSPITAL_COMMUNITY)
Admission: EM | Admit: 2023-09-24 | Discharge: 2023-09-24 | Disposition: A | Discharge status: Home / Self Care | Payer: Worker's Compensation | Attending: Emergency Medicine | Admitting: Emergency Medicine

## 2023-09-24 ENCOUNTER — Other Ambulatory Visit: Payer: Self-pay

## 2023-09-24 DIAGNOSIS — M25461 Effusion, right knee: Secondary | ICD-10-CM | POA: Insufficient documentation

## 2023-09-24 DIAGNOSIS — W1839XA Other fall on same level, initial encounter: Secondary | ICD-10-CM | POA: Insufficient documentation

## 2023-09-24 DIAGNOSIS — Y990 Civilian activity done for income or pay: Secondary | ICD-10-CM | POA: Diagnosis not present

## 2023-09-24 DIAGNOSIS — S8001XA Contusion of right knee, initial encounter: Secondary | ICD-10-CM | POA: Diagnosis not present

## 2023-09-24 DIAGNOSIS — S8991XA Unspecified injury of right lower leg, initial encounter: Secondary | ICD-10-CM | POA: Diagnosis present

## 2023-09-24 NOTE — ED Provider Notes (Signed)
 [place] EMERGENCY DEPARTMENT AT [facility] Provider Note   CSN: [id number] Arrival date & time: 09/24/23  9276     Patient presents with: Leg Injury   Henry Ray is a 63 y.o. male.  With a history of gout who presents to the ED after a fall at work.  Patient works for [employer] and was working this morning when he fell through a conveyor belt.  He suffered an injury to the right lower extremity.  He was able to walk afterwards.  Now with persistent pain mostly localized over the medial right knee.  No head injury neck injury or other complaints.  Last tetanus was within the last 5 years.   HPI     Prior to Admission medications   Medication Sig Start Date End Date Taking? Authorizing Provider  allopurinol (ZYLOPRIM) 100 MG tablet Take 100 mg by mouth daily.  11/09/10   [provider]  ciprofloxacin (CIPRO) 500 MG tablet Take 500 mg by mouth 2 (two) times daily. 28 pills in bottle  11/29/10   [provider]  clotrimazole-betamethasone (LOTRISONE) cream Apply 1 application topically daily as needed. 03/19/13   [provider]  doxycycline (VIBRA-TABS) 100 MG tablet Take 1 tablet by mouth daily. 03/27/13   [provider]  erythromycin  ophthalmic ointment Place a 1/2 inch ribbon of ointment into the lower eyelid five times a day. 10/28/20   Donah Riis A, PA-C  HYDROcodone-acetaminophen  (NORCO/VICODIN) 5-325 MG per tablet Take 1 tablet by mouth 4 (four) times daily. 03/27/13   [provider]  oxyCODONE -acetaminophen  (PERCOCET/ROXICET) 5-325 MG per tablet Take 1 tablet by mouth every 6 (six) hours as needed for severe pain. 03/29/13   Armida Culver, PA-C    Allergies: Patient has no known allergies.    Review of Systems  Updated Vital Signs BP 101/72 (BP Location: Right Arm)   Pulse 83   Temp (!) 97.4 F (36.3 C)   Resp 18   Ht 5' 7 (1.702 m)   Wt 90.7 kg   SpO2 96%   BMI 31.32 kg/m   Physical Exam Vitals and  nursing note reviewed.  HENT:     Head: Normocephalic and atraumatic.  Eyes:     Pupils: Pupils are equal, round, and reactive to light.  Cardiovascular:     Rate and Rhythm: Normal rate and regular rhythm.  Pulmonary:     Effort: Pulmonary effort is normal.     Breath sounds: Normal breath sounds.  Abdominal:     Palpations: Abdomen is soft.     Tenderness: There is no abdominal tenderness.  Musculoskeletal:     Comments: Scattered abrasions over medial right lower leg and lateral right lower leg as pictured below Tenderness to palpation mostly over the medial aspect of the right knee with no joint laxity Small effusion of the right knee Patient able to fully flex at the right hip and right knee Able to bear weight and walk Sensation tact light touch throughout bilateral lower extremities 2+ DP pulses bilaterally 5 out of 5 motor strength bilateral upper and lower extremities  Skin:    General: Skin is warm and dry.  Neurological:     Mental Status: He is alert.  Psychiatric:        Mood and Affect: Mood normal.     (all labs ordered are listed, but only abnormal results are displayed) Labs Reviewed - No data to display  EKG: None  Radiology: DG Tibia/Fibula Right  Result Date: 09/24/2023 CLINICAL DATA:  Injury while at work.  Fell through convey air. EXAM: RIGHT TIBIA AND FIBULA - 2 VIEW COMPARISON:  None. FINDINGS: No signs of acute fracture or dislocation. No significant arthropathy. Plantar and calcaneal heel spurs. IMPRESSION: 1. No acute findings. 2. Heel spurs. Electronically Signed   By: Waddell Calk M.D.   On: 09/24/2023 08:51   DG FEMUR, MIN 2 VIEWS RIGHT Result Date: 09/24/2023 CLINICAL DATA:  Injury. Patient was at work and fell through convey air EXAM: RIGHT FEMUR 2 VIEWS COMPARISON:  None Available. FINDINGS: No signs of acute fracture or dislocation. Degenerative changes noted within the right hip. IMPRESSION: 1. No acute findings. 2. Right hip osteoarthritis.  Electronically Signed   By: Waddell Calk M.D.   On: 09/24/2023 08:50   DG Knee Complete 4 Views Right Result Date: 09/24/2023 CLINICAL DATA:  Leg injury. EXAM: RIGHT KNEE - COMPLETE 4+ VIEW COMPARISON:  None Available. FINDINGS: Trace suprapatellar joint effusion. No signs of acute fracture or dislocation. No significant arthropathy. IMPRESSION: 1. Trace suprapatellar joint effusion. 2. No acute fracture. Electronically Signed   By: Waddell Calk M.D.   On: 09/24/2023 08:48     Procedures   Medications Ordered in the ED - No data to display                                  Medical Decision Making 63 year old male presenting for right lower extremity injury sustained while at work.  Patient fell through a conveyor belt and suffered an injury of the right lower extremity.  Now with pain over the right knee.  Able to bear weight after the injury.  Exam notable for scattered abrasions and ecchymosis over the medial knee with tenderness over the medial knee.  No joint laxity.  No weakness with good range of motion.  X-ray demonstrated small joint effusion with no fracture or dislocation.  Will provide with knee sleeve and instruction for orthopedic follow-up.  Counseled patient on symptomatic management with rest ice compression elevation at home.  NSAIDs and Tylenol  for pain relief.  Amount and/or Complexity of Data Reviewed Radiology: ordered.        Final diagnoses:  Effusion of right knee joint  Contusion of right knee, initial encounter    ED Discharge Orders     None          Pamella Ozell LABOR, DO 09/24/23 9082

## 2023-09-24 NOTE — Progress Notes (Signed)
 Orthopedic Tech Progress Note Patient Details:  Henry Ray 12/13/1960 [id number]  Ortho Devices Type of Ortho Device: Knee Sleeve Ortho Device/Splint Location: RLE Ortho Device/Splint Interventions: Ordered, Application, Adjustment   Post Interventions Patient Tolerated: Fair Instructions Provided: Care of device, Adjustment of device  Mykelti Goldenstein Ronal Brasil 09/24/2023, 10:28 AM

## 2023-09-24 NOTE — ED Triage Notes (Addendum)
 Pt states he was at work and fell through conveyor. Pt has abrasions to right knee and lower leg. Pt states he also hit left leg. Pt states he was unable to put pressure on right leg after. Movement and sensation intact distal to injury.  Last tetanus w/in 5 years.

## 2023-09-24 NOTE — Discharge Instructions (Addendum)
 You were seen in the emergency department for a right leg injury that happened while you are at work The x-ray showed some swelling of the right knee with no broken bones or dislocations We provided you with a knee sleeve for compression and support You can walk as tolerated When you get home you can and elevate and apply ice Take Tylenol  Motrin as directed for pain Please follow-up with Dr.Swinteck who is an orthopedic specialist  Return to the emergency department for severe pain, if you are unable to walk or have any other concerns

## 2023-10-13 ENCOUNTER — Other Ambulatory Visit: Payer: Self-pay | Admitting: Emergency Medicine

## 2023-10-13 DIAGNOSIS — M25561 Pain in right knee: Secondary | ICD-10-CM

## 2023-10-23 ENCOUNTER — Other Ambulatory Visit

## 2023-10-30 ENCOUNTER — Ambulatory Visit
Admission: RE | Admit: 2023-10-30 | Discharge: 2023-10-30 | Disposition: A | Discharge status: Home / Self Care | Payer: Worker's Compensation | Source: Ambulatory Visit | Attending: Emergency Medicine | Admitting: Emergency Medicine

## 2023-10-30 ENCOUNTER — Other Ambulatory Visit

## 2023-10-30 DIAGNOSIS — M25561 Pain in right knee: Secondary | ICD-10-CM

## 2024-03-13 ENCOUNTER — Encounter: Payer: Self-pay | Admitting: Emergency Medicine

## 2024-03-13 ENCOUNTER — Ambulatory Visit: Admission: EM | Admit: 2024-03-13 | Discharge: 2024-03-13 | Disposition: A | Discharge status: Home / Self Care

## 2024-03-13 ENCOUNTER — Other Ambulatory Visit: Payer: Self-pay

## 2024-03-13 DIAGNOSIS — M7989 Other specified soft tissue disorders: Secondary | ICD-10-CM | POA: Diagnosis not present

## 2024-03-13 DIAGNOSIS — Z8739 Personal history of other diseases of the musculoskeletal system and connective tissue: Secondary | ICD-10-CM

## 2024-03-13 DIAGNOSIS — M79674 Pain in right toe(s): Secondary | ICD-10-CM

## 2024-03-13 MED ORDER — PREDNISONE 20 MG PO TABS: 40.0000 mg | ORAL_TABLET | Freq: Every day | ORAL | 0 refills | Status: AC

## 2024-03-13 MED ORDER — DEXAMETHASONE SOD PHOSPHATE PF 10 MG/ML IJ SOLN
10.0000 mg | Freq: Once | INTRAMUSCULAR | Status: AC
  Administered 2024-03-13: 10 mg via INTRAMUSCULAR

## 2024-03-13 NOTE — ED Triage Notes (Addendum)
 Pt reports right foot pain for last several days and states hx of gout. Denies injury and reports pain is similar to gout flare-up. Report removed ingrown toenail from right great toe this am.

## 2024-03-13 NOTE — Discharge Instructions (Signed)
 You were given an injection of Decadron  10 mg.  Start the prednisone  tomorrow.  While you are taking the prednisone , do not take any additional NSAIDs to include ibuprofen, Aleve, Motrin, naproxen, or Advil.  Recommend Tylenol  for breakthrough pain or discomfort. Elevate the right lower extremity above the level of the heart is much as possible to help with pain or swelling. You may apply ice to the affected area to help with pain and inflammation. Continue dietary modifications to prevent future gout flares to include avoiding red meat, alcohol, or seafood. If symptoms fail to improve, you may follow-up in this clinic or with your primary care physician for further evaluation. Follow-up as needed.

## 2024-03-13 NOTE — ED Provider Notes (Signed)
 " [facility]    CSN: [id number] Arrival date & time: 03/13/24  1257      History   Chief Complaint Chief Complaint  Patient presents with   Foot Pain    HPI Henry Ray is a 63 y.o. male.   The history is provided by the patient.   Patient presents for complaints of right foot pain is been present for the past several days.  Patient denies injury or trauma.  States that the pain started in the ball of his foot under the right great toe and has since moved into the right great toe.  He states that the right great toe is red, swollen, and warm to touch.  He reports prior history of gout, states his last flare was more than 1 year ago.  States that he is currently taking allopurinol daily.  Further denies numbness, tingling, the end of or the inability to bear weight.  Patient states he has been taking Tylenol  for his symptoms.  States symptoms feel similar to his previous gout flares.  States that prior to his symptoms starting, he did have red meat.  States that he also removed an ingrown toenail from the right great toe this morning.  Past Medical History:  Diagnosis Date   Anal fissure    Gout    Gout    Umbilical hernia     Patient Active Problem List   Diagnosis Date Noted   Rectal bleeding 12/10/2010   Right sided abdominal pain 12/10/2010   Fatty liver 12/10/2010    Past Surgical History:  Procedure Laterality Date   COLONOSCOPY  12/17/2010   Procedure: COLONOSCOPY;  Surgeon: Margo CHRISTELLA Haddock, MD;  Location: [facility];  Service: Endoscopy;  Laterality: N/A;  1:00   none         Home Medications    Prior to Admission medications  Medication Sig Start Date End Date Taking? Authorizing Provider  predniSONE  (DELTASONE ) 20 MG tablet Take 2 tablets (40 mg total) by mouth daily with breakfast for 5 days. 03/13/24 03/18/24 Yes Leath-Warren, Etta PARAS, NP  allopurinol (ZYLOPRIM) 100 MG tablet Take 100 mg by mouth daily.  11/09/10   [provider]  ciprofloxacin (CIPRO) 500 MG tablet Take 500 mg by mouth 2 (two) times daily. 28 pills in bottle  11/29/10   [provider]  clotrimazole-betamethasone (LOTRISONE) cream Apply 1 application topically daily as needed. 03/19/13   [provider]  colchicine 0.6 MG tablet TAKE TWO TABLETS AT ONSET, THEN ONE HOUR LATER TAKE ONE TABLET. THEN NEXT DAY TAKE ONE TABLET TWICE DAILY FOR FOR GOUT FLARE    [provider]  doxycycline (VIBRA-TABS) 100 MG tablet Take 1 tablet by mouth daily. 03/27/13   [provider]  erythromycin  ophthalmic ointment Place a 1/2 inch ribbon of ointment into the lower eyelid five times a day. 10/28/20   Donah Penne LABOR, PA-C  HYDROcodone-acetaminophen  (NORCO/VICODIN) 5-325 MG per tablet Take 1 tablet by mouth 4 (four) times daily. 03/27/13   [provider]  oxyCODONE -acetaminophen  (PERCOCET/ROXICET) 5-325 MG per tablet Take 1 tablet by mouth every 6 (six) hours as needed for severe pain. 03/29/13   Armida Culver, PA-C    Family History Family History  Problem Relation Age of Onset   Colon cancer Neg Hx     Social History Social History[1]   Allergies   Patient has no known allergies.   Review of Systems Review of Systems Per HPI  Physical Exam Triage Vital Signs ED Triage Vitals  Encounter Vitals Group     BP 03/13/24 1327 (!) 144/96     Girls Systolic BP Percentile --      Girls Diastolic BP Percentile --      Boys Systolic BP Percentile --      Boys Diastolic BP Percentile --      Pulse Rate 03/13/24 1327 75     Resp 03/13/24 1327 20     Temp 03/13/24 1327 98.2 F (36.8 C)     Temp Source 03/13/24 1327 Oral     SpO2 03/13/24 1327 96 %     Weight --      Height --      Head Circumference --      Peak Flow --      Pain Score 03/13/24 1325 10     Pain Loc --      Pain Education --      Exclude from Growth Chart --    No data found.  Updated Vital Signs BP (!) 144/96 (BP Location: Right Arm)    Pulse 75   Temp 98.2 F (36.8 C) (Oral)   Resp 20   SpO2 96%   Visual Acuity Right Eye Distance:   Left Eye Distance:   Bilateral Distance:    Right Eye Near:   Left Eye Near:    Bilateral Near:     Physical Exam Vitals and nursing note reviewed.  Constitutional:      General: He is not in acute distress.    Appearance: Normal appearance.  HENT:     Head: Normocephalic.  Eyes:     Extraocular Movements: Extraocular movements intact.     Pupils: Pupils are equal, round, and reactive to light.  Cardiovascular:     Rate and Rhythm: Normal rate and regular rhythm.     Pulses: Normal pulses.     Heart sounds: Normal heart sounds.  Pulmonary:     Effort: Pulmonary effort is normal. No respiratory distress.     Breath sounds: Normal breath sounds. No stridor. No wheezing, rhonchi or rales.  Musculoskeletal:     Cervical back: Normal range of motion.     Right foot: Decreased range of motion (d/t pain and swelling of right great toe). Normal capillary refill. Swelling (right great toe) and tenderness (right great toe) present. Normal pulse.  Feet:     Right foot:     Skin integrity: Erythema (joint of right great toe) and warmth (joint of right great toe) present. No ulcer, blister, skin breakdown, callus, dry skin or fissure.  Skin:    General: Skin is warm and dry.  Neurological:     General: No focal deficit present.     Mental Status: He is alert and oriented to person, place, and time.  Psychiatric:        Mood and Affect: Mood normal.        Behavior: Behavior normal.      UC Treatments / Results  Labs (all labs ordered are listed, but only abnormal results are displayed) Labs Reviewed  URIC ACID    EKG   Radiology No results found.  Procedures Procedures (including critical care time)  Medications Ordered in UC Medications  dexamethasone  (DECADRON ) injection 10 mg (10 mg Intramuscular Given 03/13/24 1341)    Initial Impression / Assessment and  Plan / UC Course  I have reviewed the triage vital signs and the nursing notes.  Pertinent labs &  imaging results that were available during my care of the patient were reviewed by me and considered in my medical decision making (see chart for details).  Patient presents with a several day history of redness, warmth, and swelling of the right great toe.  He reports underlying history of gout.  Symptoms are consistent with gout.  He denies injury or trauma, will defer imaging at this time.  Uric acid level is pending.  Decadron  10 mg IM administered for inflammation, will start patient on prednisone  40 mg for the next 5 days.  He is currently taking allopurinol at this time.  Supportive care recommendations were provided and discussed with the patient to include continuing over-the-counter analgesics, continuing dietary modifications, and RICE therapy.  Patient was given information on how to obtain a primary care physician through the [facility] health system.  Patient was in agreement with this plan of care and verbalizes understanding.  All questions were answered.  Patient stable for discharge.   Final Clinical Impressions(s) / UC Diagnoses   Final diagnoses:  Pain and swelling of toe of right foot  History of gout     Discharge Instructions      You were given an injection of Decadron  10 mg.  Start the prednisone  tomorrow.  While you are taking the prednisone , do not take any additional NSAIDs to include ibuprofen, Aleve, Motrin, naproxen, or Advil.  Recommend Tylenol  for breakthrough pain or discomfort. Elevate the right lower extremity above the level of the heart is much as possible to help with pain or swelling. You may apply ice to the affected area to help with pain and inflammation. Continue dietary modifications to prevent future gout flares to include avoiding red meat, alcohol, or seafood. If symptoms fail to improve, you may follow-up in this clinic or with your primary care physician  for further evaluation. Follow-up as needed.     ED Prescriptions     Medication Sig Dispense Auth. Provider   predniSONE  (DELTASONE ) 20 MG tablet Take 2 tablets (40 mg total) by mouth daily with breakfast for 5 days. 10 tablet Leath-Warren, Etta PARAS, NP      PDMP not reviewed this encounter.    [1]  Social History Tobacco Use   Smoking status: Never  Vaping Use   Vaping status: Never Used  Substance Use Topics   Alcohol use: Yes    Comment: beer on weekends   Drug use: No     Gilmer Etta PARAS, NP 03/13/24 1351  "

## 2024-03-14 LAB — URIC ACID: Uric Acid: 9.1 mg/dL — ABNORMAL HIGH (ref 3.8–8.4)

## 2024-03-15 ENCOUNTER — Ambulatory Visit (HOSPITAL_COMMUNITY): Payer: Self-pay
